# Patient Record
Sex: Male | Born: 1990 | Race: Black or African American | Hispanic: No | Marital: Married | Smoking: Light tobacco smoker
Health system: Southern US, Community
[De-identification: ages and names within clinical notes are randomized; demographics above are authoritative.]

## PROBLEM LIST (undated history)

## (undated) HISTORY — PX: ANKLE FRACTURE SURGERY: SHX122

---
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
## 2011-08-14 IMAGING — CR DG CHEST 2V
2 series · 2 of 2 positions shown · non-contrast
Comparison: None

CLINICAL DATA: Chest pain.

CHEST - 2 VIEW

[w chest pa *]
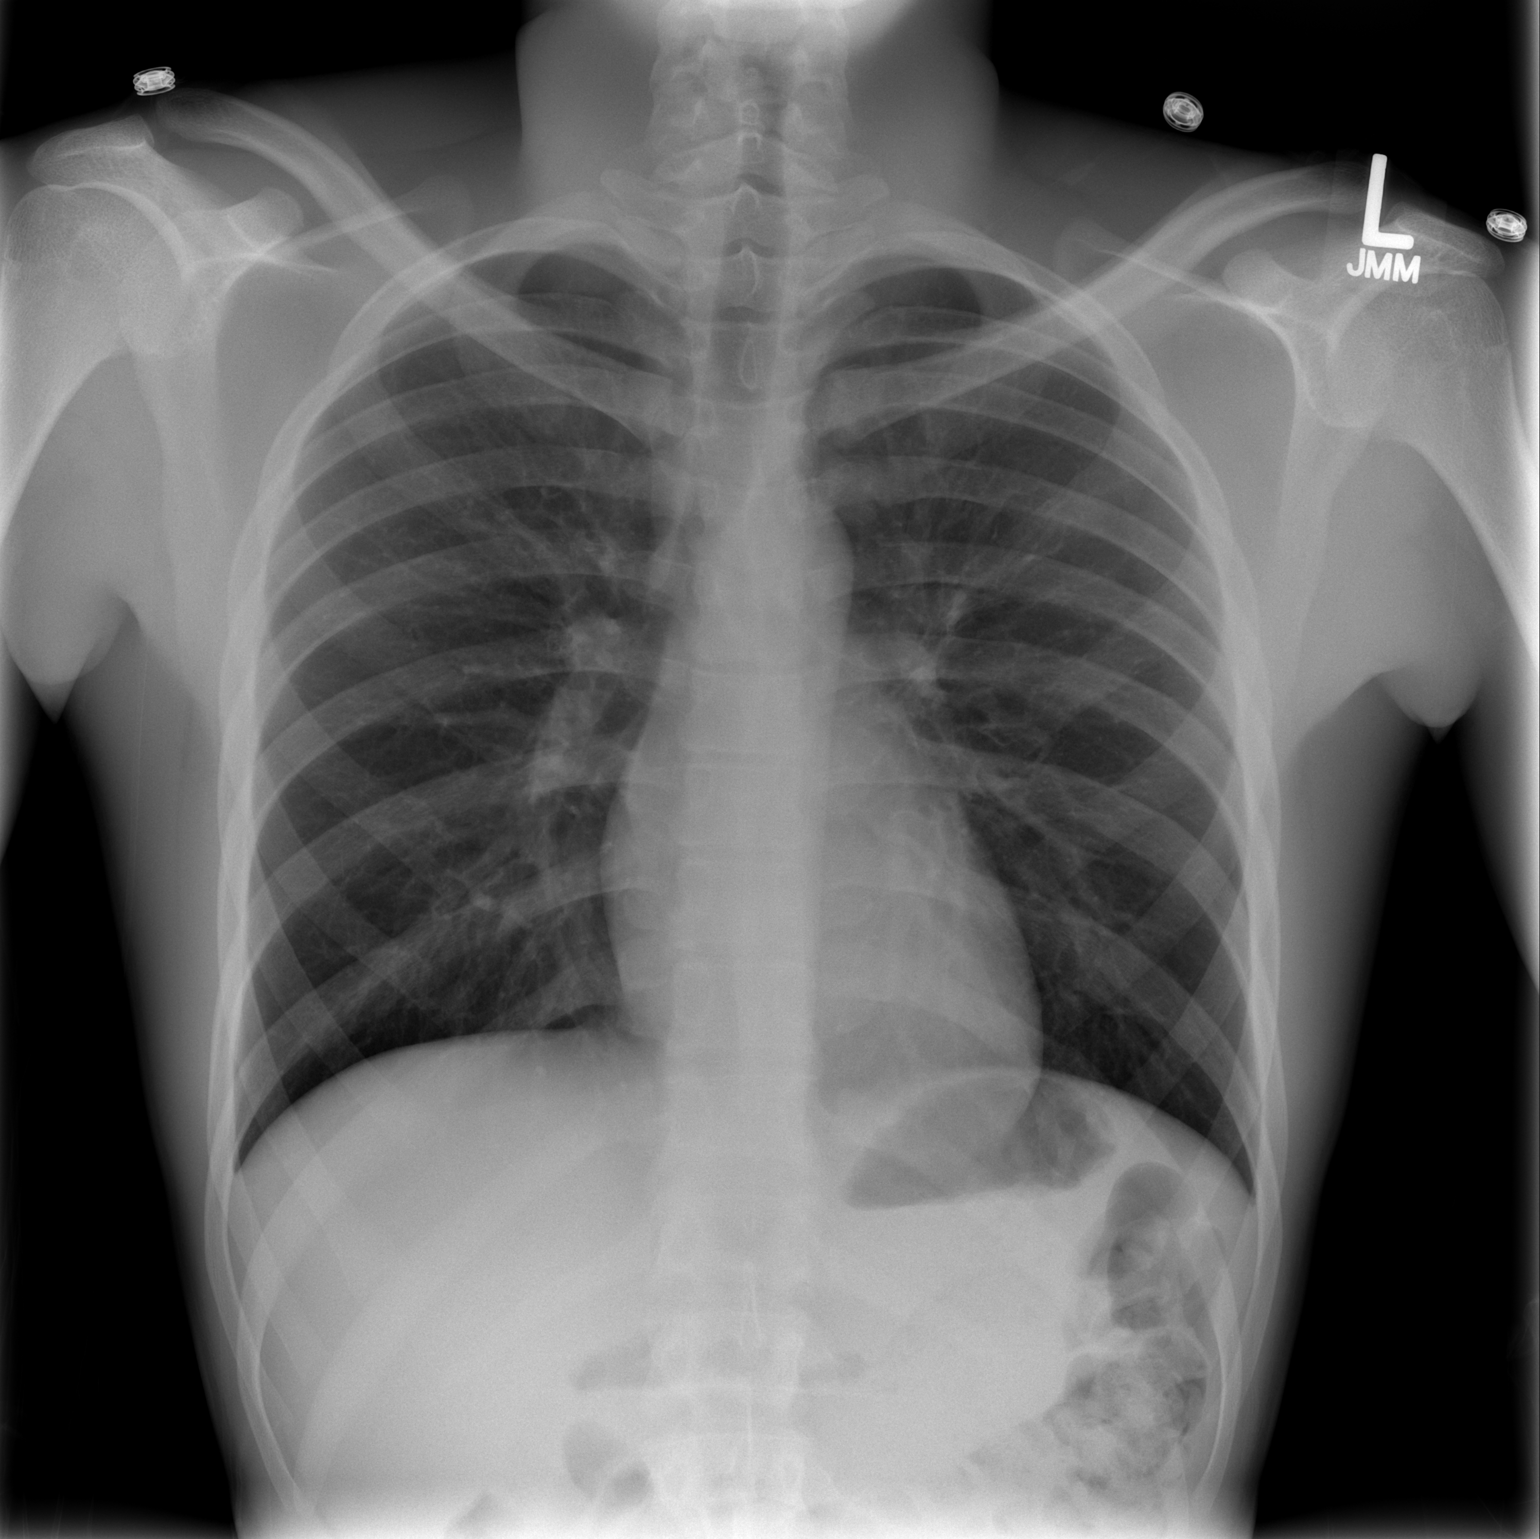

[w chest lat *]
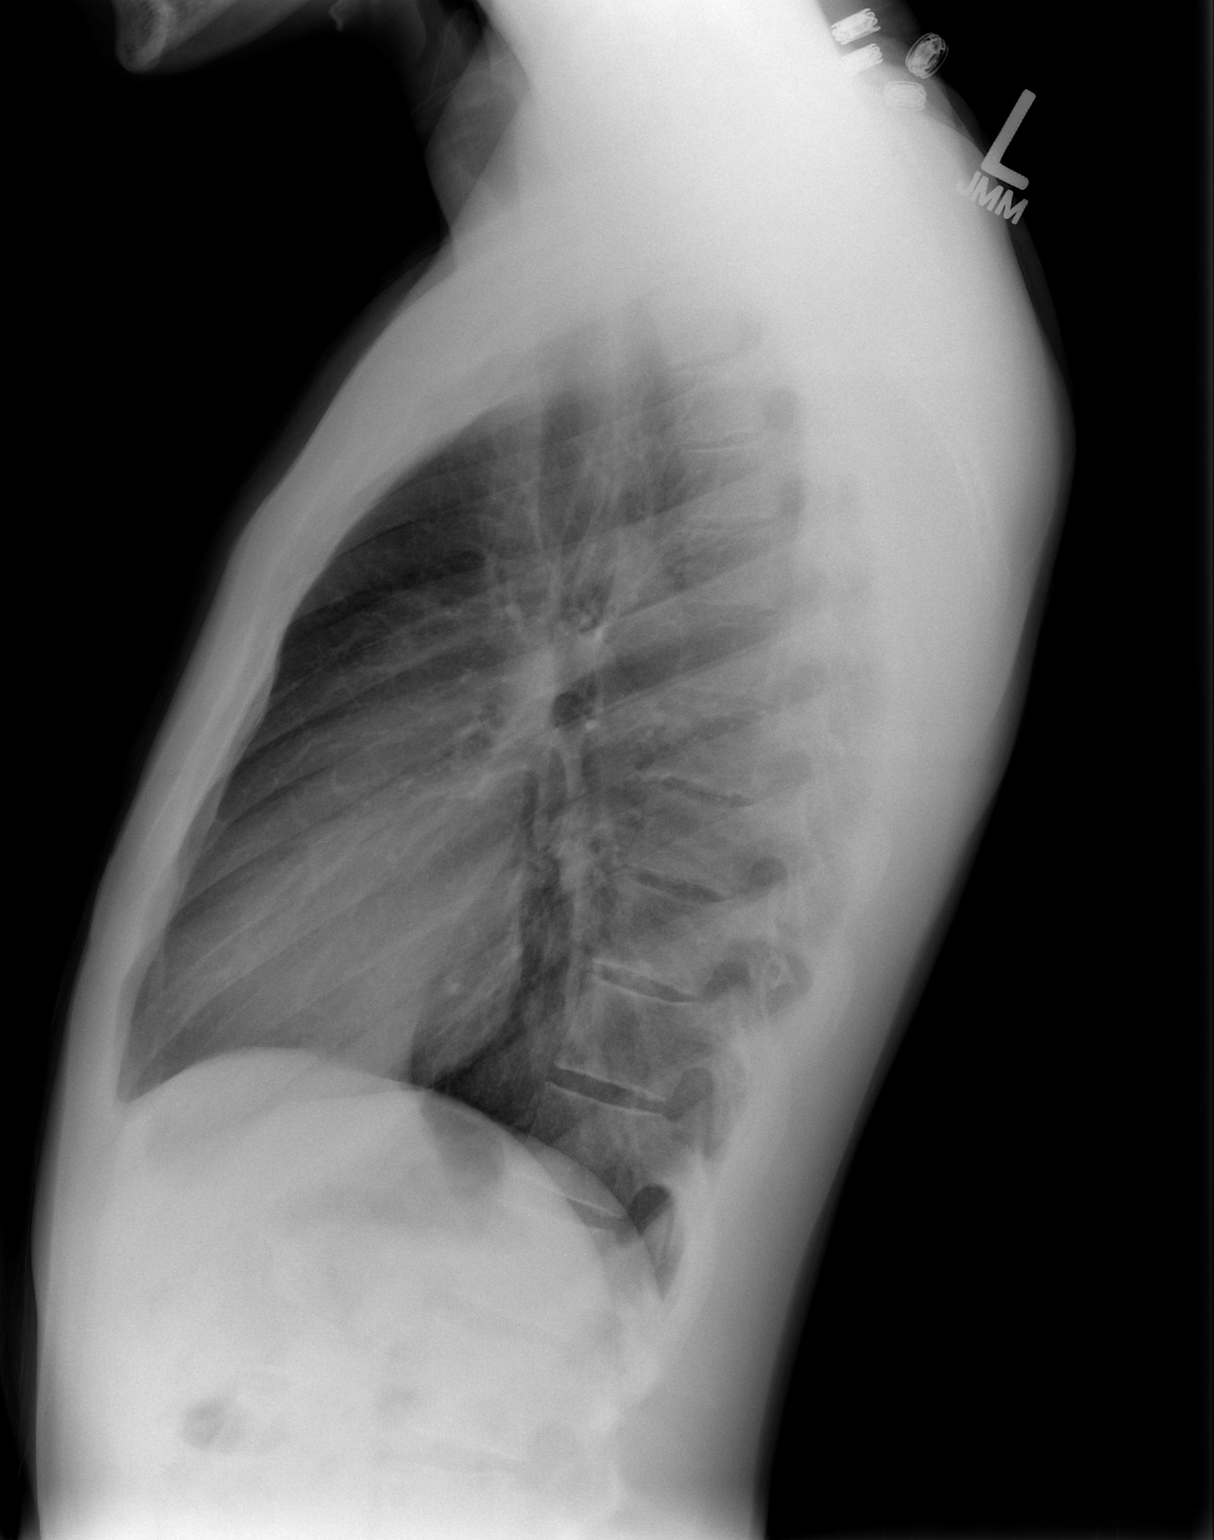

[2 of 2 positions shown; findings below may reference images not displayed]

FINDINGS: Heart and mediastinal contours are within normal limits.
No focal opacities or effusions.  No acute bony abnormality.
IMPRESSION: No active cardiopulmonary disease.

## 2013-08-06 ENCOUNTER — Encounter (HOSPITAL_COMMUNITY): Payer: Self-pay | Admitting: Emergency Medicine

## 2013-08-06 ENCOUNTER — Emergency Department (HOSPITAL_COMMUNITY)
Admission: EM | Admit: 2013-08-06 | Discharge: 2013-08-06 | Disposition: A | Discharge status: Home / Self Care | Payer: Self-pay | Attending: Emergency Medicine | Admitting: Emergency Medicine

## 2013-08-06 DIAGNOSIS — H109 Unspecified conjunctivitis: Secondary | ICD-10-CM | POA: Insufficient documentation

## 2013-08-06 DIAGNOSIS — F17200 Nicotine dependence, unspecified, uncomplicated: Secondary | ICD-10-CM | POA: Insufficient documentation

## 2013-08-06 MED ORDER — ERYTHROMYCIN 5 MG/GM OP OINT
TOPICAL_OINTMENT | Freq: Once | OPHTHALMIC | Status: DC
  Filled 2013-08-06: qty 1

## 2013-08-06 NOTE — ED Provider Notes (Signed)
CSN: [id number]     Arrival date & time 08/06/13  1204 History   This chart was scribed for Earley FavorGail Tinleigh Whitmire, NP, working with Cephus RicherJosh Zavitz, MD, by Ellin MayhewMichael Levi, ED Scribe. This patient was seen in room TR04C/TR04C and the patient's care was started at 12:25 PM.  Chief Complaint  Patient presents with  . Eye Drainage   The history is provided by the patient. No language interpreter was used.   HPI Comments: Sergio Gilmore is a 23 y.o. male who presents to the Emergency Department complaining of clear eye drainage, bilaterally with onset thursday. Patient states his eyes are red and matted upon waking with a burning sensation. Patient has been using warm compresses during the day. Denies runny nose or any cold symptoms. No PCP. Patient confirms being around wood stoves and a fireplace recently.    History reviewed. No pertinent past medical history. Past Surgical History  Procedure Laterality Date  . Ankle fracture surgery     History reviewed. No pertinent family history. History  Substance Use Topics  . Smoking status: Light Tobacco Smoker -- 0.25 packs/day    Types: Cigarettes  . Smokeless tobacco: Never Used  . Alcohol Use: Yes     Comment: occasionally     Review of Systems  HENT: Negative for rhinorrhea.   Eyes: Positive for discharge and redness. Negative for photophobia, itching and visual disturbance.  Neurological: Negative for headaches.  All other systems reviewed and are negative.   Allergies  Review of patient's allergies indicates no known allergies.  Home Medications  No current outpatient prescriptions on file.  Triage Vitals:BP 138/87  Pulse 90  Temp(Src) 97.6 F (36.4 C) (Oral)  Resp 20  SpO2 100%  Physical Exam  Nursing note and vitals reviewed. Constitutional: He is oriented to person, place, and time. He appears well-developed and well-nourished.  HENT:  Head: Normocephalic.  Right Ear: External ear normal.  Left Ear: External ear normal.  Eyes:  Pupils are equal, round, and reactive to light. Right eye exhibits no discharge. Left eye exhibits no discharge. Right conjunctiva is injected. Left conjunctiva is injected.  Inflamed bilaterally, upper/lower.  Cardiovascular: Normal rate and regular rhythm.   Pulmonary/Chest: Effort normal.  Musculoskeletal: Normal range of motion.  Lymphadenopathy:    He has no cervical adenopathy.  Neurological: He is alert and oriented to person, place, and time.  Skin: Skin is warm. No erythema.    ED Course  Procedures (including critical care time)  DIAGNOSTIC STUDIES: Oxygen Saturation is 100% on RA, normal by my interpretation.    COORDINATION OF CARE: 12:34 PM-Recommended patient to keep away from fireplaces, and wood stoves, and to keep a high standard of hygiene, especially in products that he uses on his face. Recommended using warm compress 5 mins before applying ointment medication, which will be ordered today. Treatment plan discussed with patient and patient agrees.  Labs Review Labs Reviewed - No data to display Imaging Review No results found.  EKG Interpretation   None       MDM   1. Conjunctivitis      I personally performed the services described in this documentation, which was scribed in my presence. The recorded information has been reviewed and is accurate.   Arman FilterGail K Nikita Humble, NP 08/06/13 1256

## 2013-08-06 NOTE — Discharge Instructions (Signed)
Bacterial Conjunctivitis Bacterial conjunctivitis (commonly called pink eye) is redness, soreness, or puffiness (inflammation) of the white part of your eye. It is caused by a germ called bacteria. These germs can easily spread from person to person (contagious). Your eye often will become red or pink. Your eye may also become irritated, watery, or have a thick discharge.  HOME CARE   Apply a cool, clean washcloth over closed eyelids. Do this for 10 20 minutes, 3 4 times a day while you have pain.  Gently wipe away any fluid coming from the eye with a warm, wet washcloth or cotton ball.  Wash your hands often with soap and water. Use paper towels to dry your hands.  Do not share towels or washcloths.  Change or wash your pillowcase every day.  Do not use eye makeup until the infection is gone.  Do not use machines or drive if your vision is blurry.  Stop using contact lenses. Do not use them again until your doctor says it is okay.  Do not touch the tip of the eye drop bottle or medicine tube with your fingers when you put medicine on the eye. GET HELP RIGHT AWAY IF:   Your eye is not better after 3 days of starting your medicine.  You have a yellowish fluid coming out of the eye.  You have more pain in the eye.  Your eye redness is spreading.  Your vision becomes blurry.  You have a fever or lasting symptoms for more than 2-3 days.  You have a fever and your symptoms suddenly get worse.  You have pain in the face.  Your face gets red or puffy (swollen). MAKE SURE YOU:   Understand these instructions.  Will watch this condition.  Will get help right away if you are not doing well or get worse. Document Released: 03/31/2008 Document Revised: 06/08/2012 Document Reviewed: 03/31/2008 [facility] Patient Information 2014 [place], [place]. Use the supplied I ointment 4 times a day as discussed, apply a warm compress to your eyes.  For 1-2 minutes prior to using the medication  use is for one day after all the redness is gone.  You have also been referred to an ophthalmologist.  If your infection.  Is not getting better or worsens

## 2013-08-06 NOTE — ED Notes (Addendum)
Onset 08-03-13 bilateral clear eye drainage, redness, matted upon awakening, burning.  Pt has been using warm compresses during day.

## 2013-08-06 NOTE — ED Provider Notes (Signed)
Medical screening examination/treatment/procedure(s) were performed by non-physician practitioner and as supervising physician I was immediately available for consultation/collaboration.  EKG Interpretation   None         Enid SkeensJoshua M Brieanne Mignone, MD 08/06/13 [phone]

## 2019-12-30 ENCOUNTER — Other Ambulatory Visit: Payer: Self-pay

## 2019-12-30 ENCOUNTER — Encounter: Payer: Self-pay | Admitting: Emergency Medicine

## 2019-12-30 ENCOUNTER — Emergency Department
Admission: EM | Admit: 2019-12-30 | Discharge: 2019-12-30 | Disposition: A | Discharge status: Home / Self Care | Payer: Self-pay | Attending: Emergency Medicine | Admitting: Emergency Medicine

## 2019-12-30 DIAGNOSIS — Y929 Unspecified place or not applicable: Secondary | ICD-10-CM | POA: Insufficient documentation

## 2019-12-30 DIAGNOSIS — Z23 Encounter for immunization: Secondary | ICD-10-CM | POA: Insufficient documentation

## 2019-12-30 DIAGNOSIS — Y9355 Activity, bike riding: Secondary | ICD-10-CM | POA: Insufficient documentation

## 2019-12-30 DIAGNOSIS — Y999 Unspecified external cause status: Secondary | ICD-10-CM | POA: Insufficient documentation

## 2019-12-30 DIAGNOSIS — W228XXA Striking against or struck by other objects, initial encounter: Secondary | ICD-10-CM | POA: Insufficient documentation

## 2019-12-30 DIAGNOSIS — S01511A Laceration without foreign body of lip, initial encounter: Secondary | ICD-10-CM | POA: Insufficient documentation

## 2019-12-30 DIAGNOSIS — F17210 Nicotine dependence, cigarettes, uncomplicated: Secondary | ICD-10-CM | POA: Insufficient documentation

## 2019-12-30 MED ORDER — LIDOCAINE HCL (PF) 1 % IJ SOLN
5.0000 mL | Freq: Once | INTRAMUSCULAR | Status: AC
Start: 2019-12-30 — End: 2019-12-30
  Administered 2019-12-30: 5 mL
  Filled 2019-12-30: qty 5

## 2019-12-30 MED ORDER — AMOXICILLIN 500 MG PO CAPS: 500.0000 mg | ORAL_CAPSULE | Freq: Three times a day (TID) | ORAL | 0 refills | Status: AC

## 2019-12-30 MED ORDER — TETANUS-DIPHTH-ACELL PERTUSSIS 5-2.5-18.5 LF-MCG/0.5 IM SUSP
0.5000 mL | Freq: Once | INTRAMUSCULAR | Status: AC
  Administered 2019-12-30: 0.5 mL via INTRAMUSCULAR
  Filled 2019-12-30: qty 0.5

## 2019-12-30 MED ORDER — BACITRACIN-NEOMYCIN-POLYMYXIN 400-5-5000 EX OINT
TOPICAL_OINTMENT | Freq: Once | CUTANEOUS | Status: AC
  Administered 2019-12-30: 1 via TOPICAL
  Filled 2019-12-30: qty 1
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
## 2019-12-30 NOTE — Discharge Instructions (Addendum)
Return emergency department for any sign of infection Wash the area daily with a small amount of soap and water.  Do not clean the area with hydrogen peroxide Once the sutures are removed you can apply cocoa butter to decrease scarring If the sutures do not absorb, he should have the removed in 1 week

## 2022-11-02 ENCOUNTER — Emergency Department (HOSPITAL_COMMUNITY): Payer: Self-pay

## 2022-11-02 ENCOUNTER — Emergency Department (HOSPITAL_COMMUNITY)
Admission: EM | Admit: 2022-11-02 | Discharge: 2022-11-02 | Disposition: A | Discharge status: Home / Self Care | Payer: Self-pay | Attending: Emergency Medicine | Admitting: Emergency Medicine

## 2022-11-02 DIAGNOSIS — S93402A Sprain of unspecified ligament of left ankle, initial encounter: Secondary | ICD-10-CM | POA: Insufficient documentation

## 2022-11-02 DIAGNOSIS — Y9351 Activity, roller skating (inline) and skateboarding: Secondary | ICD-10-CM | POA: Insufficient documentation

## 2022-11-02 MED ORDER — IBUPROFEN 600 MG PO TABS: 600.0000 mg | ORAL_TABLET | Freq: Four times a day (QID) | ORAL | 0 refills | Status: DC | PRN

## 2022-11-02 MED ORDER — IBUPROFEN 600 MG PO TABS: 600.0000 mg | ORAL_TABLET | Freq: Four times a day (QID) | ORAL | 0 refills | Status: AC | PRN

## 2022-11-02 MED ORDER — OXYCODONE-ACETAMINOPHEN 5-325 MG PO TABS
1.0000 | ORAL_TABLET | Freq: Once | ORAL | Status: AC
  Administered 2022-11-02: 1 via ORAL
  Filled 2022-11-02: qty 1

## 2022-11-02 NOTE — ED Provider Notes (Signed)
[facility] Emergency Department Provider Note MRN:  [id number]  Arrival date & time: 11/02/22     Chief Complaint   Ankle Pain   History of Present Illness   Sergio Gilmore is a 32 y.o. year-old male presents to the ED with chief complaint of left ankle pain.  States that he was roller skating tonight and fell and rolled his ankle.  He complains of pain with movement and palpation.  He denies any treatments PTA.  History provided by patient.   Review of Systems  Pertinent positive and negative review of systems noted in HPI.    Physical Exam   Vitals:   11/02/22 0100 11/02/22 0456  BP:  130/87  Pulse: 95 87  Resp:  16  Temp:  98.4 F (36.9 C)  SpO2:  99%    CONSTITUTIONAL:  non toxic-appearing, NAD NEURO:  Alert and oriented x 3, CN 3-12 grossly intact EYES:  eyes equal and reactive ENT/NECK:  Supple, no stridor  CARDIO:  appears well-perfused, intact distal pulses, brisk cap refill PULM:  No respiratory distress,  GI/GU:  non-distended,  MSK/SPINE:  No gross deformities, no edema, moves all extremities  SKIN:  no rash, atraumatic   *Additional and/or pertinent findings included in MDM below  Diagnostic and Interventional Summary    EKG Interpretation  Date/Time:    Ventricular Rate:    PR Interval:    QRS Duration:   QT Interval:    QTC Calculation:   R Axis:     Text Interpretation:         Labs Reviewed - No data to display  DG Ankle Complete Left  Final Result      Medications  oxyCODONE-acetaminophen (PERCOCET/ROXICET) 5-325 MG per tablet 1 tablet (1 tablet Oral Given 11/02/22 0544)     Procedures  /  Critical Care Procedures  ED Course and Medical Decision Making  I have reviewed the triage vital signs, the nursing notes, and pertinent available records from the EMR.  Social Determinants Affecting Complexity of Care: Patient has no clinically significant social determinants affecting this chief  complaint..   ED Course:    Medical Decision Making Patient presents with injury to left ankle.  DDx includes, fracture, strain, or sprain.  Consultants: none  Plain films reveal .  Pt advised to follow up with PCP and/or orthopedics. Patient given cam boot and crutches while in ED, conservative therapy such as RICE recommended and discussed.   Patient will be discharged home & is agreeable with above plan. Returns precautions discussed. Pt appears safe for discharge.   Amount and/or Complexity of Data Reviewed Radiology: ordered and independent interpretation performed.    Details: No obvious fracture  Risk Prescription drug management.     Consultants: No consultations were needed in caring for this patient.   Treatment and Plan: Emergency department workup does not suggest an emergent condition requiring admission or immediate intervention beyond  what has been performed at this time. The patient is safe for discharge and has  been instructed to return immediately for worsening symptoms, change in  symptoms or any other concerns    Final Clinical Impressions(s) / ED Diagnoses     ICD-10-CM   1. Sprain of left ankle, unspecified ligament, initial encounter  N82.956O       ED Discharge Orders          Ordered    ibuprofen (ADVIL) 600 MG tablet  Every 6 hours PRN  11/02/22 6045              Discharge Instructions Discussed with and Provided to Patient:   Discharge Instructions   None      Roxy Horseman, PA-C 11/02/22 0636    Nira Conn, MD 11/02/22 [phone]

## 2022-11-02 NOTE — ED Triage Notes (Signed)
Pt was playing basketball and believes he rolled ankle again. Ankle is swelling. PMH of previous broken ankle.

## 2022-11-02 NOTE — ED Provider Triage Note (Signed)
  Emergency Medicine Provider Triage Evaluation Note  MRN:  [id number]  Arrival date & time: 11/02/22    Medically screening exam initiated at 12:40 AM.   CC:   Ankle Pain   HPI:  Sergio Gilmore is a 32 y.o. year-old male presents to the ED with chief complaint of fall while roller skating.  History provided by patient. ROS:  -As included in HPI PE:   Vitals:   11/02/22 0015 11/02/22 0019  BP: (!) 140/80   Pulse: (!) 120   Resp: 18   Temp:  99 F (37.2 C)  SpO2: 99%     Non-toxic appearing No respiratory distress Swelling to left ankle MDM:  Based on signs and symptoms, fx or dislocation is highest on my differential. I've ordered imaging in triage to expedite lab/diagnostic workup.  Patient was informed that the remainder of the evaluation will be completed by another provider, this initial triage assessment does not replace that evaluation, and the importance of remaining in the ED until their evaluation is complete.    Roxy Horseman, PA-C 11/02/22 0040

## 2022-11-02 NOTE — Progress Notes (Signed)
Orthopedic Tech Progress Note Patient Details:  Sergio Gilmore 1990-12-11 [id number]  Ortho Devices Type of Ortho Device: CAM walker Ortho Device/Splint Location: lle Ortho Device/Splint Interventions: Ordered, Application, Adjustment   Post Interventions Patient Tolerated: Well Instructions Provided: Care of device, Adjustment of device  Trinna Post 11/02/2022, 6:33 AM
